# Patient Record
Sex: Male | Born: 1995 | Race: Black or African American | Hispanic: No | Marital: Single | Smoking: Never smoker
Health system: Southern US, Community
[De-identification: ages and names within clinical notes are randomized; demographics above are authoritative.]

## PROBLEM LIST (undated history)

## (undated) HISTORY — PX: HAND SURGERY: SHX662

---

## 2017-12-28 ENCOUNTER — Emergency Department (HOSPITAL_COMMUNITY): Payer: Self-pay

## 2017-12-28 ENCOUNTER — Encounter (HOSPITAL_COMMUNITY): Payer: Self-pay

## 2017-12-28 ENCOUNTER — Emergency Department (HOSPITAL_COMMUNITY)
Admission: EM | Admit: 2017-12-28 | Discharge: 2017-12-28 | Disposition: A | Discharge status: Home / Self Care | Payer: Self-pay | Attending: Emergency Medicine | Admitting: Emergency Medicine

## 2017-12-28 ENCOUNTER — Other Ambulatory Visit: Payer: Self-pay

## 2017-12-28 DIAGNOSIS — S62346A Nondisplaced fracture of base of fifth metacarpal bone, right hand, initial encounter for closed fracture: Secondary | ICD-10-CM | POA: Insufficient documentation

## 2017-12-28 DIAGNOSIS — Y929 Unspecified place or not applicable: Secondary | ICD-10-CM | POA: Insufficient documentation

## 2017-12-28 DIAGNOSIS — Y939 Activity, unspecified: Secondary | ICD-10-CM | POA: Insufficient documentation

## 2017-12-28 DIAGNOSIS — W2209XA Striking against other stationary object, initial encounter: Secondary | ICD-10-CM | POA: Insufficient documentation

## 2017-12-28 DIAGNOSIS — Y999 Unspecified external cause status: Secondary | ICD-10-CM | POA: Insufficient documentation

## 2017-12-28 MED ORDER — BACITRACIN ZINC 500 UNIT/GM EX OINT
TOPICAL_OINTMENT | Freq: Two times a day (BID) | CUTANEOUS | Status: DC
  Administered 2017-12-28: 1 via TOPICAL
  Filled 2017-12-28: qty 28.35

## 2017-12-28 MED ORDER — NAPROXEN 375 MG PO TABS: 375.0000 mg | ORAL_TABLET | Freq: Two times a day (BID) | ORAL | 0 refills | Status: AC

## 2017-12-28 NOTE — ED Provider Notes (Signed)
[facility]-EMERGENCY DEPT Provider Note   CSN: [id number] Arrival date & time: 12/28/17  2000     History   Chief Complaint Chief Complaint  Patient presents with  . Hand Pain    HPI Charles Gilmore. is a 22 y.o. male who presents to the ED with hand pain. Pt reports punching a locker and now has hand pain and abrasions to the fingers. Patient denies any other injuries.  HPI  History reviewed. No pertinent past medical history.  There are no active problems to display for this patient.   History reviewed. No pertinent surgical history.      Home Medications    Prior to Admission medications   Medication Sig Start Date End Date Taking? Authorizing Provider  naproxen (NAPROSYN) 375 MG tablet Take 1 tablet (375 mg total) by mouth 2 (two) times daily. 12/28/17   Janne Napoleon, NP    Family History History reviewed. No pertinent family history.  Social History Social History   Tobacco Use  . Smoking status: Not on file  Substance Use Topics  . Alcohol use: Not Currently  . Drug use: Not Currently     Allergies   Patient has no known allergies.   Review of Systems Review of Systems  Musculoskeletal: Positive for arthralgias.  Skin: Positive for wound.  All other systems reviewed and are negative.    Physical Exam Updated Vital Signs BP 121/82   Pulse 68   Temp 99.2 F (37.3 C) (Oral)   Resp 18   SpO2 100%   Physical Exam  Constitutional: He appears well-developed and well-nourished. No distress.  HENT:  Head: Normocephalic and atraumatic.  Eyes: EOM are normal.  Neck: Neck supple.  Cardiovascular: Normal rate.  Pulmonary/Chest: Effort normal.  Musculoskeletal:       Right hand: He exhibits tenderness, bony tenderness and swelling. He exhibits normal capillary refill. Decreased range of motion: due to pain and swelling. Normal sensation noted. Normal strength noted.  Swelling and tenderness over the 5th MC. Abrasions to  the ring and middle fingers.   Neurological: He is alert.  Skin: Skin is warm and dry.  Psychiatric: He has a normal mood and affect.  Nursing note and vitals reviewed.    ED Treatments / Results  Labs (all labs ordered are listed, but only abnormal results are displayed) Labs Reviewed - No data to display  Radiology Dg Hand Complete Right  Result Date: 12/28/2017 CLINICAL DATA:  Right hand pain after punching injury EXAM: RIGHT HAND - COMPLETE 3+ VIEW COMPARISON:  None. FINDINGS: Nondisplaced non articular shaft fracture in the right fifth metacarpal with surrounding soft tissue swelling and mild apex dorsal angulation. No additional fracture. No dislocation. No suspicious focal osseous lesions. No significant arthropathy. No radiopaque foreign body. IMPRESSION: Nondisplaced non articular right fifth metacarpal shaft fracture as detailed. Electronically Signed   By: Delbert Phenix M.D.   On: 12/28/2017 20:44    Procedures Procedures (including critical care time)  Medications Ordered in ED Medications - No data to display   Initial Impression / Assessment and Plan / ED Course  I have reviewed the triage vital signs and the nursing notes. 22 y.o. male here with right hand pain s/p injury stable for d/c without focal neuro deficits. Fracture to the 5th MC. Ulnar gutter splint, ice, elevation, NSAIDS and f/u with Dr. Orlan Leavens.  Final Clinical Impressions(s) / ED Diagnoses   Final diagnoses:  Closed nondisplaced fracture of base of fifth  metacarpal bone of right hand, initial encounter    ED Discharge Orders         Ordered    naproxen (NAPROSYN) 375 MG tablet  2 times daily     12/28/17 2105           Kerrie Buffalo [place], [place] 12/28/17 2109    Derwood Kaplan, MD 12/29/17 [phone]

## 2017-12-28 NOTE — ED Triage Notes (Signed)
Pt reports punching something and no has hand pain. Swelling and tenderness noted to the lateral side of the right hand.

## 2017-12-28 NOTE — Discharge Instructions (Addendum)
Call Dr. Glenna Durand office to schedule follow up. Return here as needed.

## 2019-05-28 IMAGING — CR DG HAND COMPLETE 3+V*R*
3 series · 3 of 3 positions shown · non-contrast
Comparison: None.

CLINICAL DATA: Right hand pain after punching injury

EXAM:
RIGHT HAND - COMPLETE 3+ VIEW

[x hand pa right]
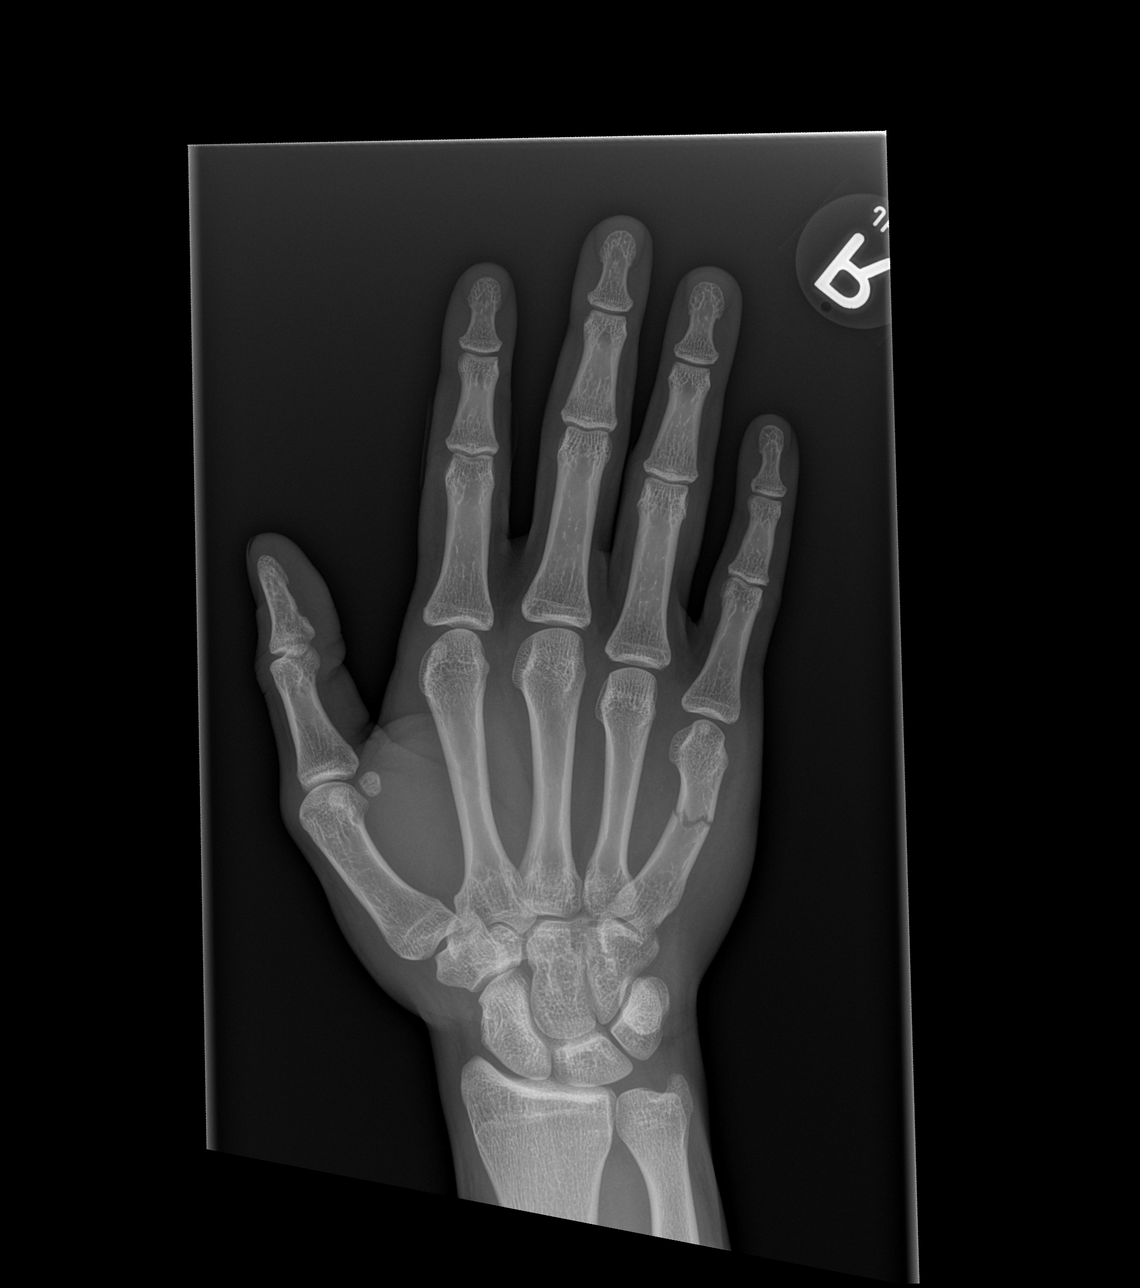

[x hand obl right]
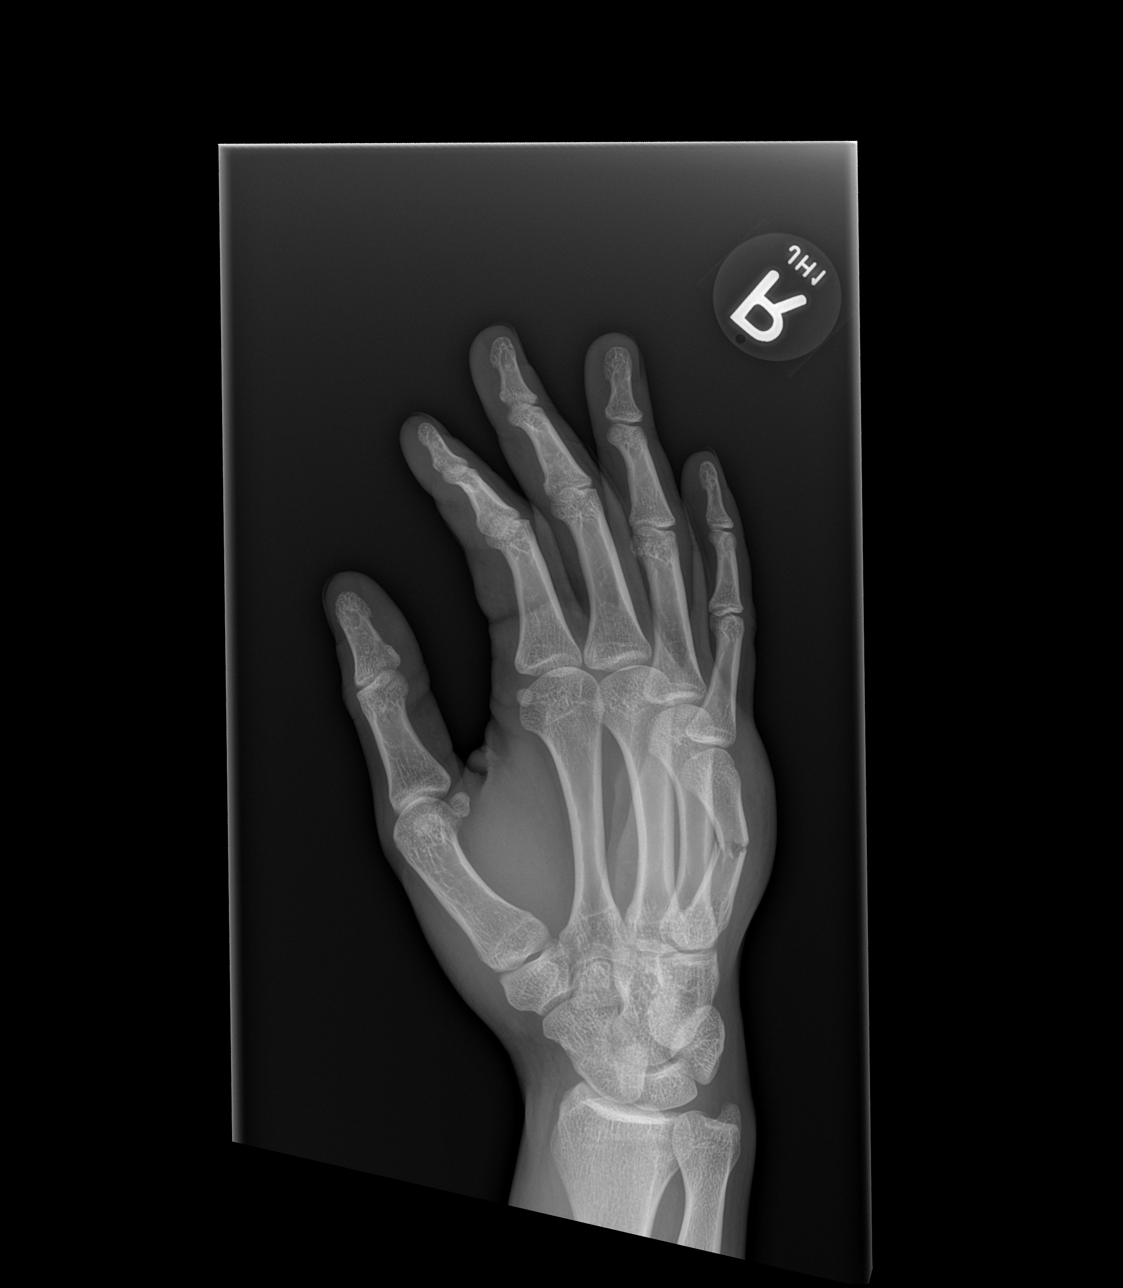

[x hand lat right]
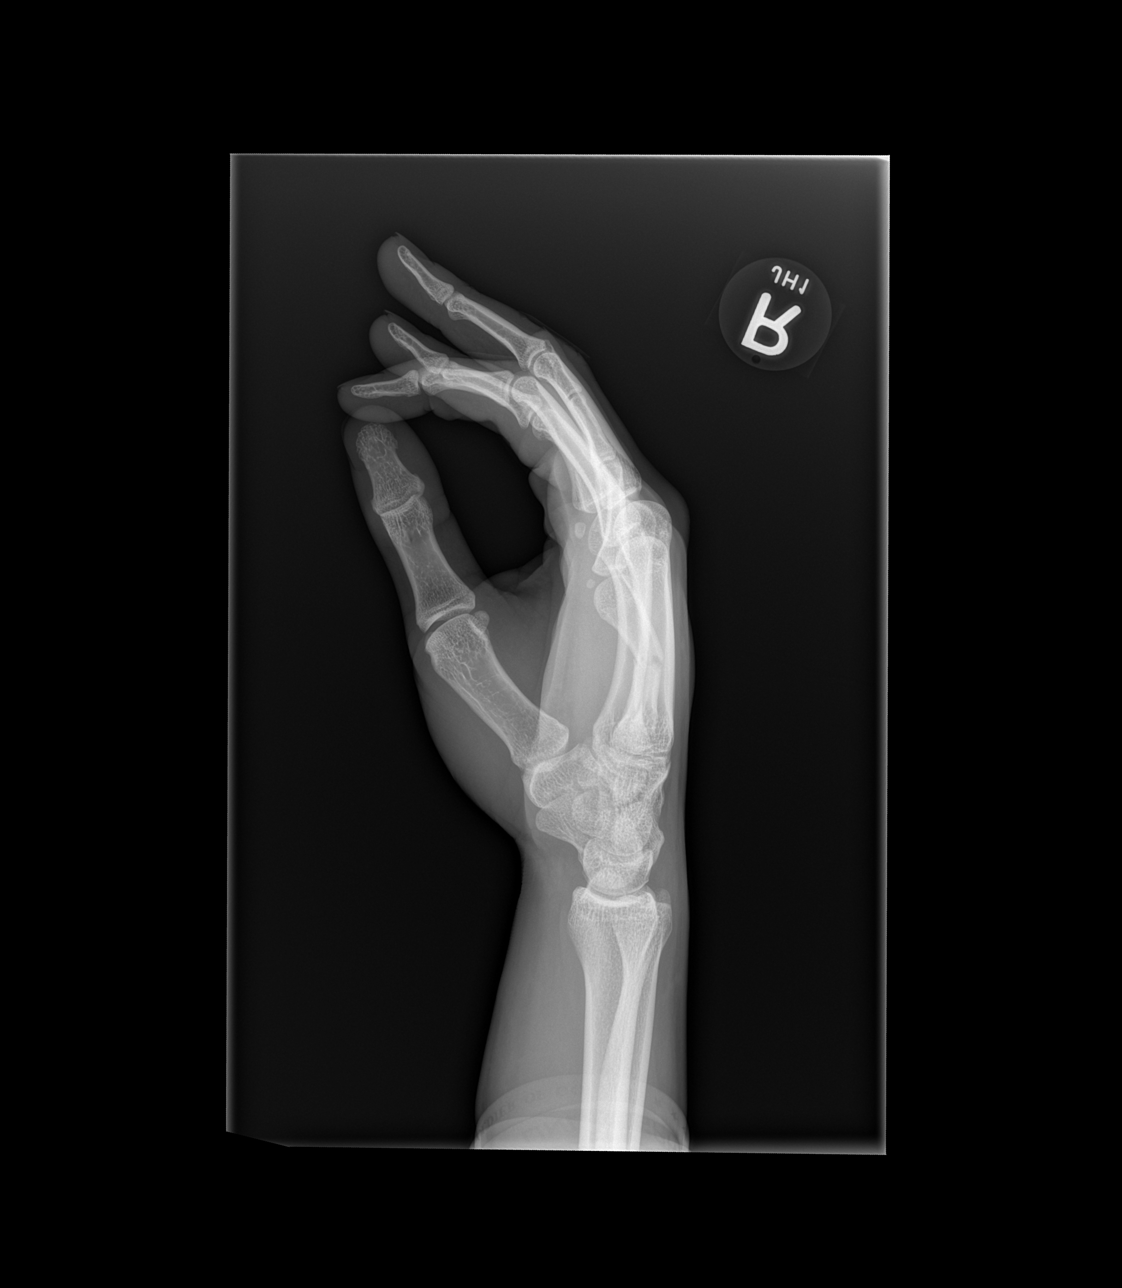

[3 of 3 positions shown; findings below may reference images not displayed]

FINDINGS: Nondisplaced non articular shaft fracture in the right fifth
metacarpal with surrounding soft tissue swelling and mild apex
dorsal angulation. No additional fracture. No dislocation. No
suspicious focal osseous lesions. No significant arthropathy. No
radiopaque foreign body.
IMPRESSION: Nondisplaced non articular right fifth metacarpal shaft fracture as
detailed.

## 2021-02-02 ENCOUNTER — Other Ambulatory Visit: Payer: Self-pay

## 2021-02-03 ENCOUNTER — Encounter: Payer: Self-pay | Admitting: Nurse Practitioner

## 2021-02-03 ENCOUNTER — Ambulatory Visit (INDEPENDENT_AMBULATORY_CARE_PROVIDER_SITE_OTHER): Payer: 59 | Admitting: Nurse Practitioner

## 2021-02-03 VITALS — BP 100/64 | HR 60 | Temp 97.7°F | Ht 74.0 in | Wt 199.6 lb

## 2021-02-03 DIAGNOSIS — Z0000 Encounter for general adult medical examination without abnormal findings: Secondary | ICD-10-CM

## 2021-02-03 DIAGNOSIS — Z23 Encounter for immunization: Secondary | ICD-10-CM

## 2021-02-03 NOTE — Progress Notes (Signed)
Subjective:    Patient ID: Charles Gilmore, male    DOB: December 06, 1995, 25 y.o.   MRN: [id number]  Patient presents today for CPE   HPI  Vision:will schedule Dental:will schedule Diet:regular Exercise:basketball x1hr and weight training 3x/week Weight:  Wt Readings from Last 3 Encounters:  02/03/21 199 lb 9.6 oz (90.5 kg)    Sexual History (orientation,birth control, marital status, STD):declined need for STD screen, agreed to completed HPV vaccine  Depression/Suicide: No flowsheet data found. No flowsheet data found.  Immunizations: (TDAP, Hep C screen, Pneumovax, Influenza, zoster)  Health Maintenance  Topic Date Due   HPV Vaccine (2 - Male 3-dose series) 12/08/2015   Flu Shot  06/30/2021*   Tetanus Vaccine  02/03/2022*   Hepatitis C Screening: USPSTF Recommendation to screen - Ages 18-79 yo.  02/03/2022*   HIV Screening  02/03/2022*   Pneumococcal Vaccination  Aged Out  *Topic was postponed. The date shown is not the original due date.   Fall Risk: Fall Risk  02/03/2021  Falls in the past year? 0  Number falls in past yr: 0  Injury with Fall? 0  Risk for fall due to : No Fall Risks  Follow up Falls evaluation completed   Medications and allergies reviewed with patient and updated if appropriate.  There are no problems to display for this patient.  Current Outpatient Medications on File Prior to Visit  Medication Sig Dispense Refill   naproxen (NAPROSYN) 375 MG tablet Take 1 tablet (375 mg total) by mouth 2 (two) times daily. 20 tablet 0   No current facility-administered medications on file prior to visit.    History reviewed. No pertinent past medical history.  Past Surgical History:  Procedure Laterality Date   HAND SURGERY Right    Broke hand and had to have surgery, age 29    Social History   Socioeconomic History   Marital status: Single    Spouse name: Not on file   Number of children: Not on file   Years of education: Not on file   Highest  education level: Not on file  Occupational History   Not on file  Tobacco Use   Smoking status: Never   Smokeless tobacco: Never  Vaping Use   Vaping Use: Never used  Substance and Sexual Activity   Alcohol use: Never   Drug use: Never   Sexual activity: Not Currently  Other Topics Concern   Not on file  Social History Narrative   Not on file   Social Determinants of Health   Financial Resource Strain: Not on file  Food Insecurity: Not on file  Transportation Needs: Not on file  Physical Activity: Not on file  Stress: Not on file  Social Connections: Not on file    Family History  Problem Relation Age of Onset   Hypertension Maternal Grandfather         Review of Systems  Constitutional:  Negative for fever, malaise/fatigue and weight loss.  HENT:  Negative for congestion and sore throat.   Eyes:        Negative for visual changes  Respiratory:  Negative for cough and shortness of breath.   Cardiovascular:  Negative for chest pain, palpitations and leg swelling.  Gastrointestinal:  Negative for blood in stool, constipation, diarrhea and heartburn.  Genitourinary:  Negative for dysuria, frequency and urgency.  Musculoskeletal:  Negative for falls, joint pain and myalgias.  Skin:  Negative for rash.  Neurological:  Negative for dizziness, sensory change and  headaches.  Endo/Heme/Allergies:  Does not bruise/bleed easily.  Psychiatric/Behavioral:  Negative for depression, substance abuse and suicidal ideas. The patient is not nervous/anxious.    Objective:   Vitals:   02/03/21 1341  BP: 100/64  Pulse: 60  Temp: 97.7 F (36.5 C)  SpO2: 99%   Body mass index is 25.63 kg/m.  Physical Examination:  Physical Exam Vitals reviewed.  Constitutional:      General: He is not in acute distress.    Appearance: He is well-developed.  HENT:     Right Ear: Tympanic membrane, ear canal and external ear normal.     Left Ear: Tympanic membrane, ear canal and external  ear normal.  Eyes:     Extraocular Movements: Extraocular movements intact.     Conjunctiva/sclera: Conjunctivae normal.  Cardiovascular:     Rate and Rhythm: Normal rate and regular rhythm.     Heart sounds: Normal heart sounds.  Pulmonary:     Effort: Pulmonary effort is normal. No respiratory distress.     Breath sounds: Normal breath sounds.  Chest:     Chest wall: No tenderness.  Abdominal:     General: Bowel sounds are normal.     Palpations: Abdomen is soft.  Musculoskeletal:        General: Normal range of motion.     Cervical back: Normal range of motion and neck supple.     Right lower leg: No edema.     Left lower leg: No edema.  Skin:    General: Skin is warm and dry.  Neurological:     Mental Status: He is alert and oriented to person, place, and time.     Deep Tendon Reflexes: Reflexes are normal and symmetric.  Psychiatric:        Mood and Affect: Mood normal.        Behavior: Behavior normal.        Thought Content: Thought content normal.    ASSESSMENT and PLAN: This visit occurred during the SARS-CoV-2 public health emergency.  Safety protocols were in place, including screening questions prior to the visit, additional usage of staff PPE, and extensive cleaning of exam room while observing appropriate contact time as indicated for disinfecting solutions.   Charles Gilmore was seen today for establish care.  Diagnoses and all orders for this visit:  Preventative health care -     CBC -     Comprehensive metabolic panel -     TSH    Continue daily exercise Send family medical history through mychart. Increase daily oral hydration and maintain high fiber diet to help with bowel movement.  Problem List Items Addressed This Visit   None Visit Diagnoses     Preventative health care    -  Primary   Relevant Orders   CBC   Comprehensive metabolic panel   TSH       Follow up: Return in about 1 year (around 02/03/2022) for CPE (fasting).  Alysia Penna,  NP

## 2021-02-03 NOTE — Patient Instructions (Signed)
Thank you for choosing  primary care  Go to lab for blood draw  Send family medical history through mychart.  Increase daily oral hydration and maintain high fiber diet to help with bowel movement.  Preventive Care 15-23 Years Old, Male Preventive care refers to lifestyle choices and visits with your health care provider that can promote health and wellness. Preventive care visits are also called wellness exams. What can I expect for my preventive care visit? Counseling During your preventive care visit, your health care provider may ask about your: Medical history, including: Past medical problems. Family medical history. Current health, including: Emotional well-being. Home life and relationship well-being. Sexual activity. Lifestyle, including: Alcohol, nicotine or tobacco, and drug use. Access to firearms. Diet, exercise, and sleep habits. Safety issues such as seatbelt and bike helmet use. Sunscreen use. Work and work Astronomer. Physical exam Your health care provider may check your: Height and weight. These may be used to calculate your BMI (body mass index). BMI is a measurement that tells if you are at a healthy weight. Waist circumference. This measures the distance around your waistline. This measurement also tells if you are at a healthy weight and may help predict your risk of certain diseases, such as type 2 diabetes and high blood pressure. Heart rate and blood pressure. Body temperature. Skin for abnormal spots. What immunizations do I need? Vaccines are usually given at various ages, according to a schedule. Your health care provider will recommend vaccines for you based on your age, medical history, and lifestyle or other factors, such as travel or where you work. What tests do I need? Screening Your health care provider may recommend screening tests for certain conditions. This may include: Lipid and cholesterol levels. Diabetes screening. This is  done by checking your blood sugar (glucose) after you have not eaten for a while (fasting). Hepatitis B test. Hepatitis C test. HIV (human immunodeficiency virus) test. STI (sexually transmitted infection) testing, if you are at risk. Talk with your health care provider about your test results, treatment options, and if necessary, the need for more tests. Follow these instructions at home: Eating and drinking  Eat a healthy diet that includes fresh fruits and vegetables, whole grains, lean protein, and low-fat dairy products. Drink enough fluid to keep your urine pale yellow. Take vitamin and mineral supplements as recommended by your health care provider. Do not drink alcohol if your health care provider tells you not to drink. If you drink alcohol: Limit how much you have to 0-2 drinks a day. Know how much alcohol is in your drink. In the [place] one drink equals one 12 oz bottle of beer (355 mL), one 5 oz glass of wine (148 mL), or one 1 oz glass of hard liquor (44 mL). Lifestyle Brush your teeth every morning and night with fluoride toothpaste. Floss one time each day. Exercise for at least 30 minutes 5 or more days each week. Do not use any products that contain nicotine or tobacco. These products include cigarettes, chewing tobacco, and vaping devices, such as e-cigarettes. If you need help quitting, ask your health care provider. Do not use drugs. If you are sexually active, practice safe sex. Use a condom or other form of protection to prevent STIs. Find healthy ways to manage stress, such as: Meditation, yoga, or listening to music. Journaling. Talking to a trusted person. Spending time with friends and family. Minimize exposure to UV radiation to reduce your risk of skin cancer. Safety Always wear  your seat belt while driving or riding in a vehicle. Do not drive: If you have been drinking alcohol. Do not ride with someone who has been drinking. If you have been using any  mind-altering substances or drugs. While texting. When you are tired or distracted. Wear a helmet and other protective equipment during sports activities. If you have firearms in your house, make sure you follow all gun safety procedures. Seek help if you have been physically or sexually abused. What's next? Go to your health care provider once a year for an annual wellness visit. Ask your health care provider how often you should have your eyes and teeth checked. Stay up to date on all vaccines. This information is not intended to replace advice given to you by your health care provider. Make sure you discuss any questions you have with your health care provider. Document Revised: 09/14/2020 Document Reviewed: 09/14/2020 Elsevier Patient Education  2022 [employer].

## 2021-02-04 LAB — CBC
HCT: 45.5 % (ref 38.5–50.0)
Hemoglobin: 15.3 g/dL (ref 13.2–17.1)
MCH: 29 pg (ref 27.0–33.0)
MCHC: 33.6 g/dL (ref 32.0–36.0)
MCV: 86.2 fL (ref 80.0–100.0)
MPV: 9.3 fL (ref 7.5–12.5)
Platelets: 286 10*3/uL (ref 140–400)
RBC: 5.28 10*6/uL (ref 4.20–5.80)
RDW: 12.6 % (ref 11.0–15.0)
WBC: 4.2 10*3/uL (ref 3.8–10.8)

## 2021-02-04 LAB — COMPREHENSIVE METABOLIC PANEL
AG Ratio: 1.6 (calc) (ref 1.0–2.5)
ALT: 30 U/L (ref 9–46)
AST: 25 U/L (ref 10–40)
Albumin: 4.8 g/dL (ref 3.6–5.1)
Alkaline phosphatase (APISO): 62 U/L (ref 36–130)
BUN: 16 mg/dL (ref 7–25)
CO2: 22 mmol/L (ref 20–32)
Calcium: 10.2 mg/dL (ref 8.6–10.3)
Chloride: 101 mmol/L (ref 98–110)
Creat: 1.13 mg/dL (ref 0.60–1.24)
Globulin: 3 g/dL (calc) (ref 1.9–3.7)
Glucose, Bld: 87 mg/dL (ref 65–99)
Potassium: 4.2 mmol/L (ref 3.5–5.3)
Sodium: 139 mmol/L (ref 135–146)
Total Bilirubin: 0.9 mg/dL (ref 0.2–1.2)
Total Protein: 7.8 g/dL (ref 6.1–8.1)

## 2021-02-04 LAB — TSH: TSH: 0.95 mIU/L (ref 0.40–4.50)

## 2021-02-09 ENCOUNTER — Encounter: Payer: Self-pay | Admitting: Nurse Practitioner

## 2022-02-09 ENCOUNTER — Encounter: Payer: Self-pay | Admitting: Nurse Practitioner

## 2022-02-09 ENCOUNTER — Telehealth: Payer: Self-pay | Admitting: Nurse Practitioner

## 2022-02-09 NOTE — Telephone Encounter (Signed)
Pt was a no show 11/10 for a cpe with [facility]. This is his first, letter sent

## 2022-02-13 NOTE — Telephone Encounter (Signed)
1st no show, fee waived, letter sent
